# Patient Record
Sex: Female | Born: 1955 | Race: White | Hispanic: No | Smoking: Never smoker
Health system: Southern US, Community
[De-identification: ages and names within clinical notes are randomized; demographics above are authoritative.]

## PROBLEM LIST (undated history)

## (undated) DIAGNOSIS — R569 Unspecified convulsions: Secondary | ICD-10-CM

## (undated) DIAGNOSIS — K5090 Crohn's disease, unspecified, without complications: Secondary | ICD-10-CM

## (undated) DIAGNOSIS — K8020 Calculus of gallbladder without cholecystitis without obstruction: Secondary | ICD-10-CM

## (undated) DIAGNOSIS — I10 Essential (primary) hypertension: Secondary | ICD-10-CM

## (undated) HISTORY — PX: CARPAL TUNNEL RELEASE: SHX101

## (undated) HISTORY — DX: Crohn's disease, unspecified, without complications: K50.90

## (undated) HISTORY — DX: Calculus of gallbladder without cholecystitis without obstruction: K80.20

---

## 2002-02-19 ENCOUNTER — Other Ambulatory Visit: Admission: RE | Admit: 2002-02-19 | Discharge: 2002-02-19 | Payer: Self-pay | Admitting: *Deleted

## 2004-06-22 ENCOUNTER — Other Ambulatory Visit: Admission: RE | Admit: 2004-06-22 | Discharge: 2004-06-22 | Payer: Self-pay | Admitting: Gynecology

## 2004-10-13 ENCOUNTER — Other Ambulatory Visit: Admission: RE | Admit: 2004-10-13 | Discharge: 2004-10-13 | Payer: Self-pay | Admitting: Gynecology

## 2005-04-15 ENCOUNTER — Other Ambulatory Visit: Admission: RE | Admit: 2005-04-15 | Discharge: 2005-04-15 | Payer: Self-pay | Admitting: Gynecology

## 2020-02-10 ENCOUNTER — Other Ambulatory Visit: Payer: Self-pay | Admitting: Gastroenterology

## 2020-02-10 DIAGNOSIS — R9389 Abnormal findings on diagnostic imaging of other specified body structures: Secondary | ICD-10-CM

## 2020-02-11 ENCOUNTER — Other Ambulatory Visit: Payer: Self-pay | Admitting: Gastroenterology

## 2020-02-11 DIAGNOSIS — R933 Abnormal findings on diagnostic imaging of other parts of digestive tract: Secondary | ICD-10-CM

## 2020-02-12 ENCOUNTER — Ambulatory Visit
Admission: RE | Admit: 2020-02-12 | Discharge: 2020-02-12 | Disposition: A | Discharge status: Home / Self Care | Payer: BC Managed Care – PPO | Source: Ambulatory Visit | Attending: Gastroenterology | Admitting: Gastroenterology

## 2020-02-12 DIAGNOSIS — R933 Abnormal findings on diagnostic imaging of other parts of digestive tract: Secondary | ICD-10-CM

## 2020-02-12 MED ORDER — IOPAMIDOL (ISOVUE-300) INJECTION 61%
100.0000 mL | Freq: Once | INTRAVENOUS | Status: AC | PRN
  Administered 2020-02-12: 100 mL via INTRAVENOUS

## 2021-03-26 IMAGING — CT CT ENTEROGRAPHY (ABD-PELV W/ CM)
1 of 6 series · 10 of 32 positions shown, 16 images · IV contrast (APPLIED)
Comparison: None

CLINICAL DATA: Terminal ileal ulceration on colonoscopy.

EXAM:
CT ABDOMEN AND PELVIS WITH CONTRAST (ENTEROGRAPHY)
TECHNIQUE: Multidetector CT of the abdomen and pelvis during bolus
administration of intravenous contrast. Negative oral contrast was
given.
CONTRAST:  100mL [id number] IOPAMIDOL (YM3JTK-RMM) INJECTION 61%

[Series 3: enterography 3 mm · axial · 0.92mm/px · z∈[+557,+925]mm · 10 of 450 slices shown, 16 images]
[im 41/450  soft-tissue]
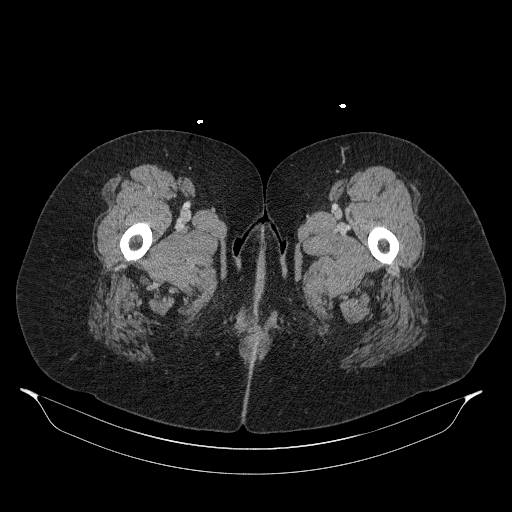
[im 41/450  bone]
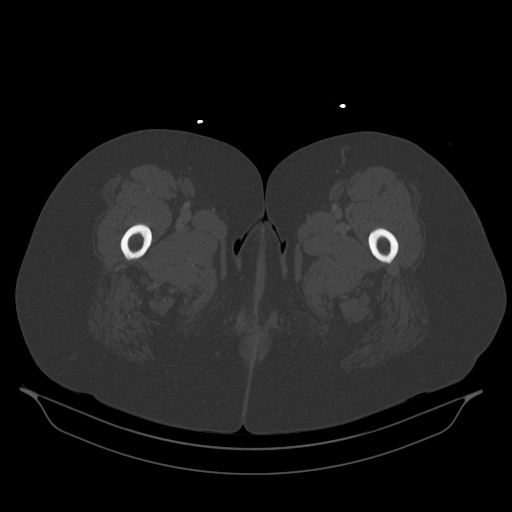
[im 82/450  soft-tissue]
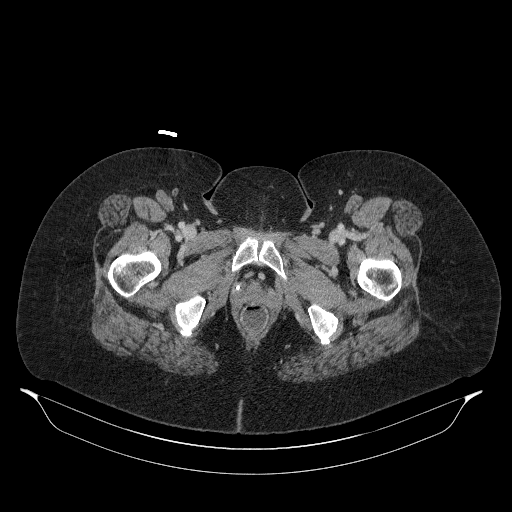
[im 123/450  soft-tissue]
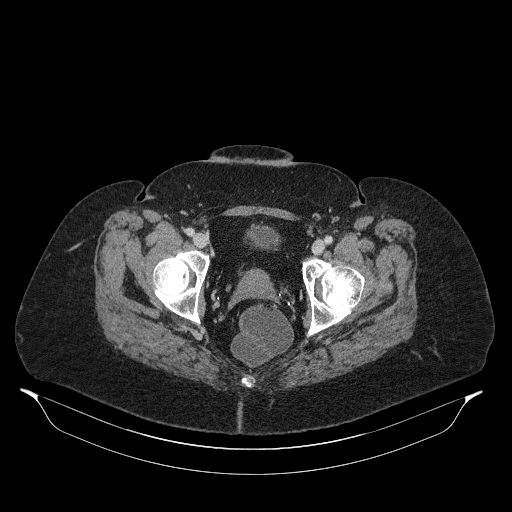
[im 164/450  soft-tissue]
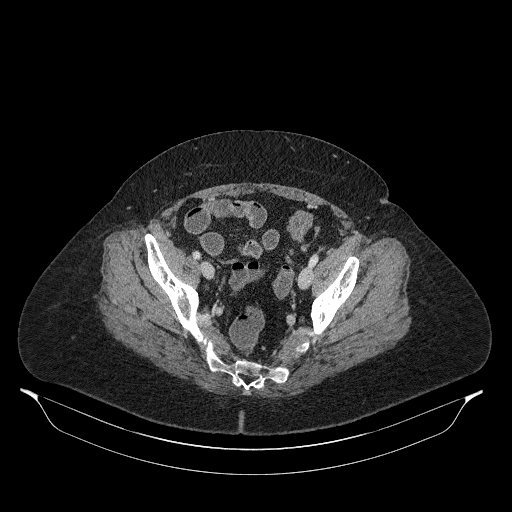
[im 205/450  soft-tissue]
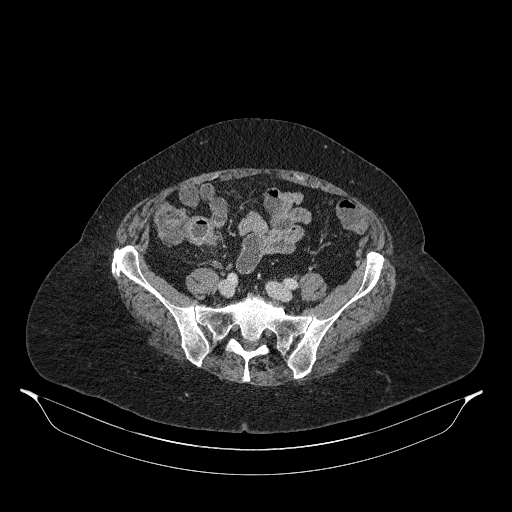
[im 245/450  soft-tissue]
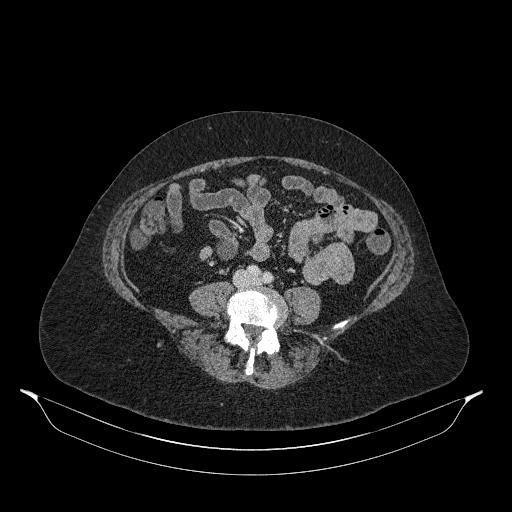
[im 286/450  soft-tissue]
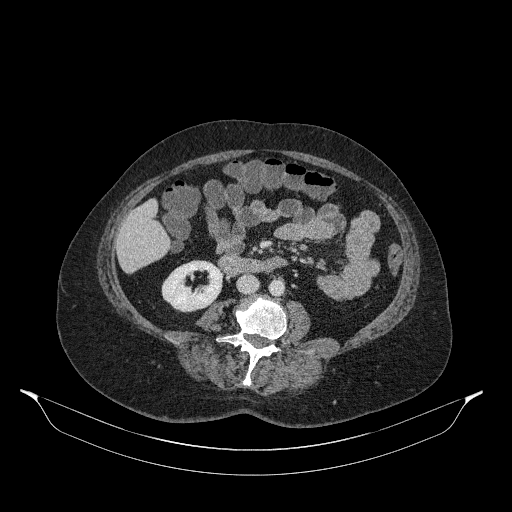
[im 286/450  lung]
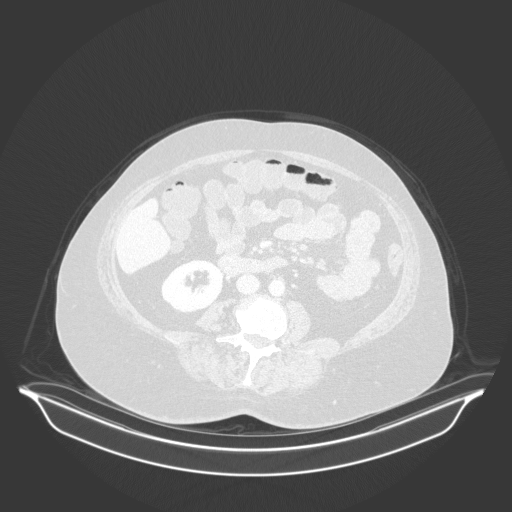
[im 327/450  soft-tissue]
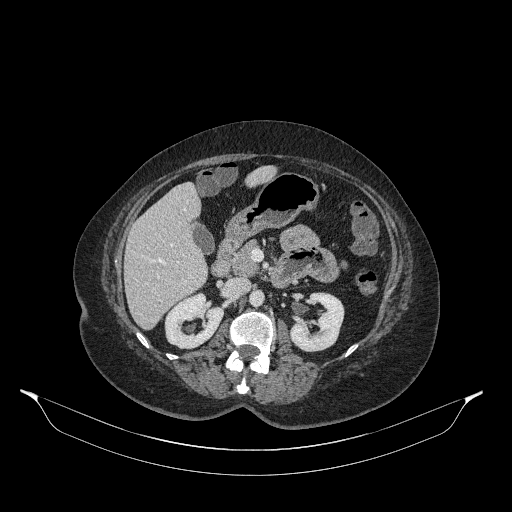
[im 327/450  lung]
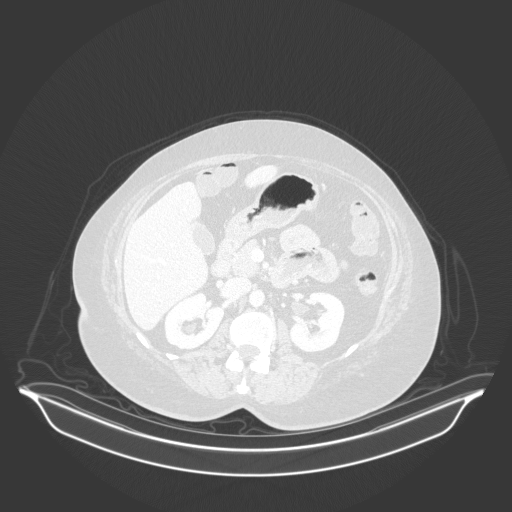
[im 368/450  soft-tissue]
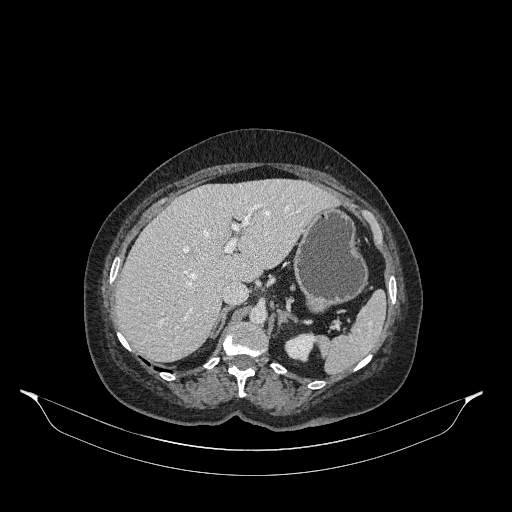
[im 368/450  lung]
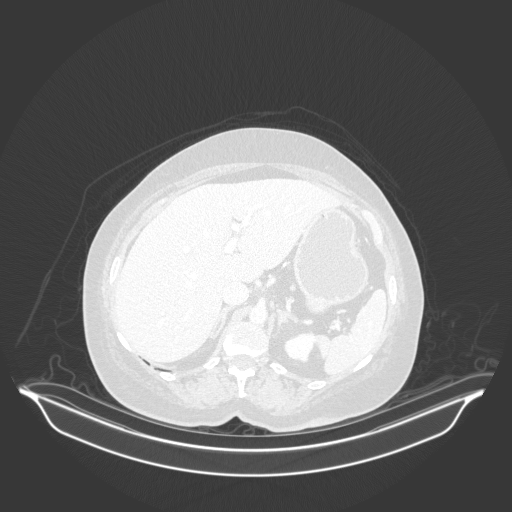
[im 368/450  bone]
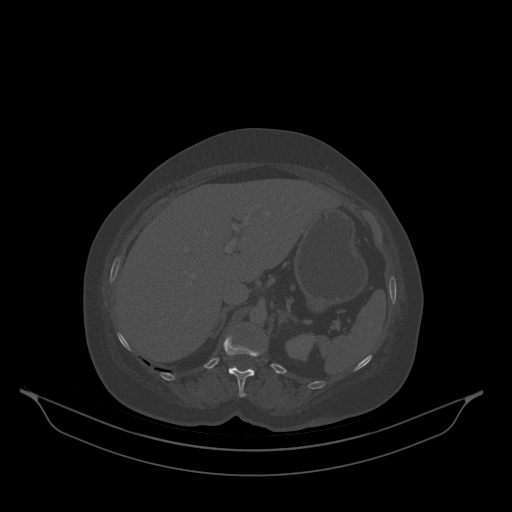
[im 409/450  soft-tissue]
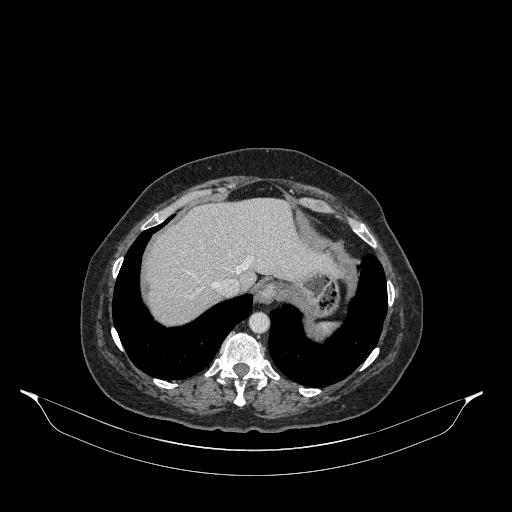
[im 409/450  lung]
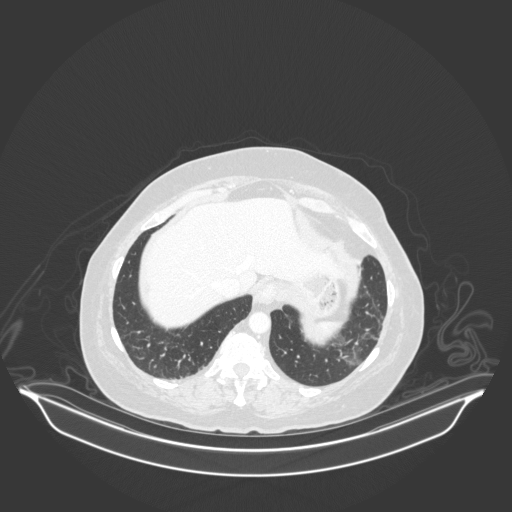

[10 of 32 positions shown; findings below may reference images not displayed]

FINDINGS: Lower chest: Clear lung bases. Normal heart size without pericardial
or pleural effusion. Tiny hiatal hernia.

Hepatobiliary: Normal liver. Normal gallbladder, without biliary
ductal dilatation.

Pancreas: Normal, without mass or ductal dilatation.

Spleen: Normal in size, without focal abnormality.

Adrenals/Urinary Tract: Normal adrenal glands. At least partially
duplicated right renal collecting system. Normal left kidney and
urinary bladder.

Stomach/Bowel: Normal remainder of the stomach. Fluid-filled colon
is likely due to neutral contrast administration. No colitis. Normal
appendix. The extent of small bowel distension with neutral contrast
is moderate. Mild terminal and distal ileal mucosal hyperenhancement
with narrowing versus underdistention at the ileocecal junction.
Example 79/10. No evidence of high-grade obstruction. Mild
perienteric edema.

No surrounding fluid collection.

Vascular/Lymphatic: Aortic atherosclerosis. Ileocolic mesenteric
adenopathy is mild. Example 8 mm node on 70/10.

Reproductive: Normal uterus and adnexa.

Other: No significant free fluid.  No free intraperitoneal air.

Musculoskeletal: Lumbosacral spondylosis. Minimal convex left lumbar
spine curvature.
IMPRESSION: 1. Terminal and distal ileitis. Given the clinical history, favored
to represent Crohn disease. Infectious enteritis could look similar.
Mild adjacent ileocolic mesenteric adenopathy, likely reactive.
2. Possible narrowing of the ileocecal junction, without evidence of
high-grade obstruction.
3.  Aortic Atherosclerosis ([id number]).

## 2022-01-04 ENCOUNTER — Other Ambulatory Visit (HOSPITAL_COMMUNITY): Payer: Self-pay | Admitting: Gastroenterology

## 2022-01-04 ENCOUNTER — Other Ambulatory Visit: Payer: Self-pay | Admitting: Gastroenterology

## 2022-01-04 DIAGNOSIS — R1011 Right upper quadrant pain: Secondary | ICD-10-CM

## 2022-01-18 ENCOUNTER — Other Ambulatory Visit (HOSPITAL_COMMUNITY): Payer: BC Managed Care – PPO

## 2022-01-18 ENCOUNTER — Encounter (HOSPITAL_COMMUNITY): Payer: BC Managed Care – PPO

## 2022-01-19 ENCOUNTER — Ambulatory Visit (HOSPITAL_COMMUNITY)
Admission: RE | Admit: 2022-01-19 | Discharge: 2022-01-19 | Disposition: A | Discharge status: Home / Self Care | Payer: BC Managed Care – PPO | Source: Ambulatory Visit | Attending: Gastroenterology | Admitting: Gastroenterology

## 2022-01-19 ENCOUNTER — Encounter (HOSPITAL_COMMUNITY): Payer: Self-pay

## 2022-01-19 ENCOUNTER — Ambulatory Visit (HOSPITAL_COMMUNITY): Admission: RE | Admit: 2022-01-19 | Payer: BC Managed Care – PPO | Source: Ambulatory Visit

## 2022-01-19 DIAGNOSIS — R1011 Right upper quadrant pain: Secondary | ICD-10-CM | POA: Diagnosis not present

## 2022-04-10 ENCOUNTER — Encounter (HOSPITAL_COMMUNITY): Payer: Self-pay | Admitting: Emergency Medicine

## 2022-04-10 ENCOUNTER — Other Ambulatory Visit: Payer: Self-pay

## 2022-04-10 ENCOUNTER — Emergency Department (HOSPITAL_COMMUNITY)
Admission: EM | Admit: 2022-04-10 | Discharge: 2022-04-11 | Disposition: A | Discharge status: Home / Self Care | Payer: BC Managed Care – PPO | Attending: Emergency Medicine | Admitting: Emergency Medicine

## 2022-04-10 DIAGNOSIS — K8050 Calculus of bile duct without cholangitis or cholecystitis without obstruction: Secondary | ICD-10-CM

## 2022-04-10 DIAGNOSIS — D649 Anemia, unspecified: Secondary | ICD-10-CM

## 2022-04-10 DIAGNOSIS — R1013 Epigastric pain: Secondary | ICD-10-CM | POA: Diagnosis present

## 2022-04-10 DIAGNOSIS — N289 Disorder of kidney and ureter, unspecified: Secondary | ICD-10-CM | POA: Diagnosis not present

## 2022-04-10 DIAGNOSIS — E871 Hypo-osmolality and hyponatremia: Secondary | ICD-10-CM | POA: Insufficient documentation

## 2022-04-10 DIAGNOSIS — R030 Elevated blood-pressure reading, without diagnosis of hypertension: Secondary | ICD-10-CM

## 2022-04-10 DIAGNOSIS — K8020 Calculus of gallbladder without cholecystitis without obstruction: Secondary | ICD-10-CM | POA: Insufficient documentation

## 2022-04-10 MED ORDER — OXYCODONE-ACETAMINOPHEN 5-325 MG PO TABS
2.0000 | ORAL_TABLET | Freq: Once | ORAL | Status: AC
  Administered 2022-04-10: 2 via ORAL
  Filled 2022-04-10: qty 2

## 2022-04-10 NOTE — ED Provider Triage Note (Signed)
  Emergency Medicine Provider Triage Evaluation Note  MRN:  [id number]  Arrival date & time: 04/10/22    Medically screening exam initiated at 11:41 PM.   CC:   Abdominal Pain   HPI:  Julie Wu is a 66 y.o. year-old female presents to the ED with chief complaint of symptomatic cholelithiasis.  Has had persistent daily pain. States that she has seen general surgery and has cholecystectomy planned, but she can't wait because the pain is so severe.  She denies vomiting or fever.  History provided by History provided by patient. ROS:  -As included in HPI PE:   Vitals:   04/10/22 2340  BP: (!) 156/93  Pulse: 74  Resp: 16  Temp: 97.7 F (36.5 C)  SpO2: 100%    Non-toxic appearing No respiratory distress RUQ pain MDM:  Based on signs and symptoms, cholelithiasis is highest on my differential, followed by cholecystitis. I've ordered labs and [place] in triage to expedite lab/diagnostic workup.  Consider surgical consultation for symptomatic cholelithiasis.  Patient was informed that the remainder of the evaluation will be completed by another provider, this initial triage assessment does not replace that evaluation, and the importance of remaining in the ED until their evaluation is complete.    Roxy Horseman, PA-C 04/10/22 2352

## 2022-04-10 NOTE — ED Triage Notes (Signed)
Patient reports persistent RUQ abdominal pain , history of gallstones and Chron's disease.

## 2022-04-11 ENCOUNTER — Emergency Department (HOSPITAL_COMMUNITY): Payer: BC Managed Care – PPO

## 2022-04-11 LAB — URINALYSIS, ROUTINE W REFLEX MICROSCOPIC
Bilirubin Urine: NEGATIVE
Glucose, UA: NEGATIVE mg/dL
Ketones, ur: NEGATIVE mg/dL
Nitrite: NEGATIVE
Protein, ur: NEGATIVE mg/dL
Specific Gravity, Urine: 1.008 (ref 1.005–1.030)
pH: 5 (ref 5.0–8.0)

## 2022-04-11 LAB — CBC WITH DIFFERENTIAL/PLATELET
Abs Immature Granulocytes: 0.03 10*3/uL (ref 0.00–0.07)
Basophils Absolute: 0 10*3/uL (ref 0.0–0.1)
Basophils Relative: 1 %
Eosinophils Absolute: 0.2 10*3/uL (ref 0.0–0.5)
Eosinophils Relative: 3 %
HCT: 34.2 % — ABNORMAL LOW (ref 36.0–46.0)
Hemoglobin: 11.7 g/dL — ABNORMAL LOW (ref 12.0–15.0)
Immature Granulocytes: 0 %
Lymphocytes Relative: 26 %
Lymphs Abs: 2.3 10*3/uL (ref 0.7–4.0)
MCH: 34 pg (ref 26.0–34.0)
MCHC: 34.2 g/dL (ref 30.0–36.0)
MCV: 99.4 fL (ref 80.0–100.0)
Monocytes Absolute: 0.7 10*3/uL (ref 0.1–1.0)
Monocytes Relative: 8 %
Neutro Abs: 5.5 10*3/uL (ref 1.7–7.7)
Neutrophils Relative %: 62 %
Platelets: 297 10*3/uL (ref 150–400)
RBC: 3.44 MIL/uL — ABNORMAL LOW (ref 3.87–5.11)
RDW: 12.8 % (ref 11.5–15.5)
WBC: 8.7 10*3/uL (ref 4.0–10.5)
nRBC: 0 % (ref 0.0–0.2)

## 2022-04-11 LAB — COMPREHENSIVE METABOLIC PANEL
ALT: 31 U/L (ref 0–44)
AST: 23 U/L (ref 15–41)
Albumin: 4.4 g/dL (ref 3.5–5.0)
Alkaline Phosphatase: 91 U/L (ref 38–126)
Anion gap: 12 (ref 5–15)
BUN: 36 mg/dL — ABNORMAL HIGH (ref 8–23)
CO2: 24 mmol/L (ref 22–32)
Calcium: 9.2 mg/dL (ref 8.9–10.3)
Chloride: 97 mmol/L — ABNORMAL LOW (ref 98–111)
Creatinine, Ser: 1.31 mg/dL — ABNORMAL HIGH (ref 0.44–1.00)
GFR, Estimated: 45 mL/min — ABNORMAL LOW (ref >60)
Glucose, Bld: 116 mg/dL — ABNORMAL HIGH (ref 70–99)
Potassium: 4.3 mmol/L (ref 3.5–5.1)
Sodium: 133 mmol/L — ABNORMAL LOW (ref 135–145)
Total Bilirubin: 0.4 mg/dL (ref 0.3–1.2)
Total Protein: 7.4 g/dL (ref 6.5–8.1)

## 2022-04-11 LAB — LIPASE, BLOOD: Lipase: 34 U/L (ref 11–51)

## 2022-04-11 MED ORDER — OXYCODONE-ACETAMINOPHEN 5-325 MG PO TABS: 2.0000 | ORAL_TABLET | ORAL | 0 refills | Status: DC | PRN

## 2022-04-11 MED ORDER — ONDANSETRON 8 MG PO TBDP: 8.0000 mg | ORAL_TABLET | Freq: Three times a day (TID) | ORAL | 0 refills | Status: AC | PRN

## 2022-04-11 NOTE — ED Provider Notes (Signed)
[facility] EMERGENCY DEPARTMENT Provider Note   CSN: [id number] Arrival date & time: 04/10/22  2327     History  Chief Complaint  Patient presents with   Abdominal Pain    Julie Wu is a 66 y.o. female.  The history is provided by the patient.  Abdominal Pain She has history of cholelithiasis and comes in with epigastric pain typical of her biliary colic episodes.  Pain started at about 11 PM.  She had eaten what seems to be a low-fat meal prior to this.  Pain did radiate around to her back.  There is no associated nausea or vomiting.  She had a similar episode 3 nights ago which lasted all night.  She had taken a dose of acetaminophen without relief.  She was worried that the pain would last all night, so she came to the emergency department.  She did receive a dose of oxycodone-acetaminophen in triage, and states that the pain was completely resolved within 20 minutes of taking that medication.  Of note, she had seen a general surgeon and had cholecystectomy scheduled for 05/02/2022, but had to reschedule because of family issues.  As of now, surgery had not been rescheduled.   Home Medications Prior to Admission medications   Medication Sig Start Date End Date Taking? Authorizing Provider  ondansetron (ZOFRAN-ODT) 8 MG disintegrating tablet Take 1 tablet (8 mg total) by mouth every 8 (eight) hours as needed for nausea or vomiting. 04/11/22  Yes Dione Booze, MD  oxyCODONE-acetaminophen (PERCOCET) 5-325 MG tablet Take 2 tablets by mouth every 4 (four) hours as needed for moderate pain or severe pain. 04/11/22  Yes Dione Booze, MD      Allergies    Patient has no known allergies.    Review of Systems   Review of Systems  Gastrointestinal:  Positive for abdominal pain.  All other systems reviewed and are negative.   Physical Exam Updated Vital Signs BP (!) 125/56   Pulse 62   Temp 97.6 F (36.4 C) (Oral)   Resp 17   SpO2 97%  Physical Exam Vitals  and nursing note reviewed.   66 year old female, resting comfortably and in no acute distress. Vital signs are significant for mildly elevated blood pressure. Oxygen saturation is 100%, which is normal. Head is normocephalic and atraumatic. PERRLA, EOMI. Oropharynx is clear. Neck is nontender and supple without adenopathy or JVD. Back is nontender and there is no CVA tenderness. Lungs are clear without rales, wheezes, or rhonchi. Chest is nontender. Heart has regular rate and rhythm without murmur. Abdomen is soft, flat, nontender without masses or hepatosplenomegaly and peristalsis is normoactive. Extremities have no cyanosis or edema, full range of motion is present. Skin is warm and dry without rash. Neurologic: Mental status is normal, cranial nerves are intact, moves all extremities equally.  ED Results / Procedures / Treatments   Labs (all labs ordered are listed, but only abnormal results are displayed) Labs Reviewed  CBC WITH DIFFERENTIAL/PLATELET - Abnormal; Notable for the following components:      Result Value   RBC 3.44 (*)    Hemoglobin 11.7 (*)    HCT 34.2 (*)    All other components within normal limits  COMPREHENSIVE METABOLIC PANEL - Abnormal; Notable for the following components:   Sodium 133 (*)    Chloride 97 (*)    Glucose, Bld 116 (*)    BUN 36 (*)    Creatinine, Ser 1.31 (*)  GFR, Estimated 45 (*)    All other components within normal limits  URINALYSIS, ROUTINE W REFLEX MICROSCOPIC - Abnormal; Notable for the following components:   Color, Urine STRAW (*)    Hgb urine dipstick MODERATE (*)    Leukocytes,Ua MODERATE (*)    Bacteria, UA RARE (*)    All other components within normal limits  LIPASE, BLOOD   Radiology US Abdomen Limited  Result Date: 04/11/2022 CLINICAL DATA:  Right upper quadrant pain EXAM: ULTRASOUND ABDOMEN LIMITED RIGHT UPPER QUADRANT COMPARISON:  Ultrasound 01/19/2022.  CT 02/12/2020 FINDINGS: Gallbladder: Cholelithiasis with  mobile gallstone present. No gallbladder wall thickening or edema. Murphy's sign is not applicable as the patient has had pain medication. Common bile duct: Diameter: 5 mm, normal Liver: No focal lesion identified. Within normal limits in parenchymal echogenicity. Portal vein is patent on color Doppler imaging with normal direction of blood flow towards the liver. Other: None. IMPRESSION: Cholelithiasis.  No evidence of acute cholecystitis. Electronically Signed   By: Burman Nieves M.D.   On: 04/11/2022 01:49    Procedures Procedures    Medications Ordered in ED Medications  oxyCODONE-acetaminophen (PERCOCET/ROXICET) 5-325 MG per tablet 2 tablet (2 tablets Oral Given 04/10/22 2350)    ED Course/ Medical Decision Making/ A&P                           Medical Decision Making Risk Prescription drug management.   Epigastric pain consistent with biliary colic.  Doubt pancreatitis, peptic ulcer, pancreatitis.  Old records are reviewed, and she has no relevant past visits to the emergency department.  I do note an office visit on 03/08/2022 with general surgery for symptomatic cholelithiasis at which time elective surgery was scheduled.  Currently, she is pain-free and with a benign abdominal exam.  Ultrasound does show cholelithiasis without evidence of cholecystitis.  I have independently viewed the images, and agree with radiologist's interpretation.  I have reviewed and interpreted her laboratory tests and my interpretation is mild anemia, mild hyponatremia, mild elevation of creatinine all of which are not felt to be clinically significant.  However, on review of care everywhere, she did have a normal creatinine on 01/24/2022.  I have discussed the case with Dr. Bonita Quin from [facility] surgery, who states that she will work with the office to try to get elective surgery scheduled soon so that she can avoid additional ED visits and potential for emergent surgery.  I have encouraged the patient to  drink plenty of fluids and have her creatinine rechecked in 1 week.  I have written prescriptions for oxycodone-acetaminophen and ondansetron to use as needed if she has recurrent episodes of pain.  Return precautions discussed.  Final Clinical Impression(s) / ED Diagnoses Final diagnoses:  Biliary colic  Elevated blood pressure reading without diagnosis of hypertension  Renal insufficiency  Normochromic normocytic anemia  Hyponatremia    Rx / DC Orders ED Discharge Orders          Ordered    ondansetron (ZOFRAN-ODT) 8 MG disintegrating tablet  Every 8 hours PRN        04/11/22 0444    oxyCODONE-acetaminophen (PERCOCET) 5-325 MG tablet  Every 4 hours PRN        04/11/22 0444              Dione Booze, MD 04/11/22 [phone]

## 2022-04-11 NOTE — Discharge Instructions (Signed)
Your creatinine (a blood test measuring your kidney function) was a little high today.  Please make sure to drink plenty of fluids.  Please see your primary care provider in 1 week to have the blood test repeated to make sure it is not getting worse.  Return to the emergency department if you are having pain which is not being relieved with the pain medication.  Also, if you ever have pain with fever, you need to come to the emergency department immediately.

## 2022-04-12 ENCOUNTER — Ambulatory Visit: Payer: Self-pay | Admitting: Surgery

## 2022-04-12 DIAGNOSIS — Z01818 Encounter for other preprocedural examination: Secondary | ICD-10-CM

## 2022-04-12 NOTE — Progress Notes (Signed)
Sent message, via epic in basket, requesting orders in epic from surgeon.  

## 2022-04-12 NOTE — Progress Notes (Addendum)
COVID Vaccine Completed: yes x5  Date of COVID positive in last 90 days: no  PCP - Burnett Kanaris, PA Cardiologist - n/a  Chest x-ray - n/a EKG - 04/15/22 Epic/chart Stress Test - n/a ECHO - n/a Cardiac Cath - n/a Pacemaker/ICD device last checked: n/a Spinal Cord Stimulator: n/a  Bowel Prep - no  Sleep Study - n/a CPAP -   Fasting Blood Sugar - n/a Checks Blood Sugar _____ times a day  Blood Thinner Instructions: n/a Aspirin Instructions: Last Dose:  Activity level: Can go up a flight of stairs and perform activities of daily living without stopping and without symptoms of chest pain or shortness of breath.  Anesthesia review:   Patient denies shortness of breath, fever, cough and chest pain at PAT appointment  Patient verbalized understanding of instructions that were given to them at the PAT appointment. Patient was also instructed that they will need to review over the PAT instructions again at home before surgery.

## 2022-04-12 NOTE — Patient Instructions (Addendum)
SURGICAL WAITING ROOM VISITATION Patients having surgery or a procedure may have no more than 2 support people in the waiting area - these visitors may rotate.   Children under the age of 36 must have an adult with them who is not the patient. If the patient needs to stay at the hospital during part of their recovery, the visitor guidelines for inpatient rooms apply. Pre-op nurse will coordinate an appropriate time for 1 support person to accompany patient in pre-op.  This support person may not rotate.    Please refer to the [facility] website for the visitor guidelines for Inpatients (after your surgery is over and you are in a regular room).    Your procedure is scheduled on: 04/22/22   Report to [facility] Main Entrance    Report to admitting at 10:30 AM   Call this number if you have problems the morning of surgery [phone]   Do not eat food :After Midnight.   After Midnight you may have the following liquids until 10:00 AM DAY OF SURGERY  Water Non-Citrus Juices (without pulp, NO RED) Carbonated Beverages Black Coffee (NO MILK/CREAM OR CREAMERS, sugar ok)  Clear Tea (NO MILK/CREAM OR CREAMERS, sugar ok) regular and decaf                             Plain Jell-O (NO RED)                                           Fruit ices (not with fruit pulp, NO RED)                                     Popsicles (NO RED)                                                               Sports drinks like Gatorade (NO RED  FOLLOW BOWEL PREP AND ANY ADDITIONAL PRE OP INSTRUCTIONS YOU RECEIVED FROM YOUR SURGEON'S OFFICE!!!     Oral Hygiene is also important to reduce your risk of infection.                                    Remember - BRUSH YOUR TEETH THE MORNING OF SURGERY WITH YOUR REGULAR TOOTHPASTE   Take these medicines the morning of surgery with A SIP OF WATER: Zofran, Percocet, Doxycycline                               You may not have any metal on your body including hair  pins, jewelry, and body piercing             Do not wear make-up, lotions, powders, perfumes, or deodorant  Do not wear nail polish including gel and S&S, artificial/acrylic nails, or any other type of covering on natural nails including finger and toenails. If you have artificial nails, gel coating, etc. that needs  to be removed by a nail salon please have this removed prior to surgery or surgery may need to be canceled/ delayed if the surgeon/ anesthesia feels like they are unable to be safely monitored.   Do not shave  48 hours prior to surgery.    Do not bring valuables to the hospital. [place] IS NOT             RESPONSIBLE   FOR VALUABLES.  DO NOT BRING YOUR HOME MEDICATIONS TO THE HOSPITAL. PHARMACY WILL DISPENSE MEDICATIONS LISTED ON YOUR MEDICATION LIST TO YOU DURING YOUR ADMISSION IN THE HOSPITAL!    Patients discharged on the day of surgery will not be allowed to drive home.  Someone NEEDS to stay with you for the first 24 hours after anesthesia.   Special Instructions: Bring a copy of your healthcare power of attorney and living will documents         the day of surgery if you haven't scanned them before.              Please read over the following fact sheets you were given: IF YOU HAVE QUESTIONS ABOUT YOUR PRE-OP INSTRUCTIONS PLEASE CALL [phone]- [facility] - Preparing for Surgery Before surgery, you can play an important role.  Because skin is not sterile, your skin needs to be as free of germs as possible.  You can reduce the number of germs on your skin by washing with CHG (chlorahexidine gluconate) soap before surgery.  CHG is an antiseptic cleaner which kills germs and bonds with the skin to continue killing germs even after washing. Please DO NOT use if you have an allergy to CHG or antibacterial soaps.  If your skin becomes reddened/irritated stop using the CHG and inform your nurse when you arrive at Short Stay. Do not shave (including legs and  underarms) for at least 48 hours prior to the first CHG shower.  You may shave your face/neck.  Please follow these instructions carefully:  1.  Shower with CHG Soap the night before surgery and the  morning of surgery.  2.  If you choose to wash your hair, wash your hair first as usual with your normal  shampoo.  3.  After you shampoo, rinse your hair and body thoroughly to remove the shampoo.                             4.  Use CHG as you would any other liquid soap.  You can apply chg directly to the skin and wash.  Gently with a scrungie or clean washcloth.  5.  Apply the CHG Soap to your body ONLY FROM THE NECK DOWN.   Do   not use on face/ open                           Wound or open sores. Avoid contact with eyes, ears mouth and   genitals (private parts).                       Wash face,  Genitals (private parts) with your normal soap.             6.  Wash thoroughly, paying special attention to the area where your    surgery  will be performed.  7.  Thoroughly rinse your body with warm water from  the neck down.  8.  DO NOT shower/wash with your normal soap after using and rinsing off the CHG Soap.                9.  Pat yourself dry with a clean towel.            10.  Wear clean pajamas.            11.  Place clean sheets on your bed the night of your first shower and do not  sleep with pets. Day of Surgery : Do not apply any lotions/deodorants the morning of surgery.  Please wear clean clothes to the hospital/surgery center.  FAILURE TO FOLLOW THESE INSTRUCTIONS MAY RESULT IN THE CANCELLATION OF YOUR SURGERY  PATIENT SIGNATURE_________________________________  NURSE SIGNATURE__________________________________  ________________________________________________________________________

## 2022-04-15 ENCOUNTER — Encounter (HOSPITAL_COMMUNITY): Payer: Self-pay

## 2022-04-15 ENCOUNTER — Encounter (HOSPITAL_COMMUNITY)
Admission: RE | Admit: 2022-04-15 | Discharge: 2022-04-15 | Disposition: A | Discharge status: Home / Self Care | Payer: BC Managed Care – PPO | Source: Ambulatory Visit | Attending: Surgery | Admitting: Surgery

## 2022-04-15 VITALS — BP 138/72 | HR 75 | Resp 14 | Ht 61.5 in | Wt 170.0 lb

## 2022-04-15 DIAGNOSIS — Z01810 Encounter for preprocedural cardiovascular examination: Secondary | ICD-10-CM | POA: Insufficient documentation

## 2022-04-15 DIAGNOSIS — I2510 Atherosclerotic heart disease of native coronary artery without angina pectoris: Secondary | ICD-10-CM | POA: Diagnosis not present

## 2022-04-15 HISTORY — DX: Unspecified convulsions: R56.9

## 2022-04-15 HISTORY — DX: Essential (primary) hypertension: I10

## 2022-04-15 NOTE — Progress Notes (Signed)
Spoke with triage nurse from [facility] and let them know about patient's in office procedure 04/14/22. She had a carpal tunnel release. A cast is currently on her left arm/wrist. She is going in next week to possibly have the cast removed but the stiches will still be present. RN will make sure Dr. Cliffton Asters is aware.

## 2022-04-21 NOTE — Anesthesia Preprocedure Evaluation (Addendum)
Anesthesia Evaluation  Patient identified by MRN, date of birth, ID band Patient awake    Reviewed: Allergy & Precautions, NPO status , Patient's Chart, lab work & pertinent test results  Airway Mallampati: III  TM Distance: >3 FB Neck ROM: Full    Dental  (+) Teeth Intact, Dental Advisory Given   Pulmonary  Snores at night, has never had sleep study   Pulmonary exam normal breath sounds clear to auscultation       Cardiovascular hypertension (148/73 in preop), Pt. on medications Normal cardiovascular exam Rhythm:Regular Rate:Normal     Neuro/Psych Seizures - (childhood), Well Controlled,  negative psych ROS   GI/Hepatic Neg liver ROS, Symptomatic cholelithiasis    Endo/Other  negative endocrine ROS  Renal/GU Renal InsufficiencyRenal diseaseCr 1.31  negative genitourinary   Musculoskeletal negative musculoskeletal ROS (+)   Abdominal   Peds  Hematology  (+) Blood dyscrasia, anemia , Hb 11.7   Anesthesia Other Findings   Reproductive/Obstetrics negative OB ROS                           Anesthesia Physical Anesthesia Plan  ASA: 2  Anesthesia Plan: General   Post-op Pain Management: Tylenol PO (pre-op)*   Induction: Intravenous  PONV Risk Score and Plan: 4 or greater and Ondansetron, Dexamethasone, Midazolam and Treatment may vary due to age or medical condition  Airway Management Planned: Oral ETT and Video Laryngoscope Planned  Additional Equipment: None  Intra-op Plan:   Post-operative Plan: Extubation in OR  Informed Consent: I have reviewed the patients History and Physical, chart, labs and discussed the procedure including the risks, benefits and alternatives for the proposed anesthesia with the patient or authorized representative who has indicated his/her understanding and acceptance.     Dental advisory given  Plan Discussed with: CRNA  Anesthesia Plan Comments:        Anesthesia Quick Evaluation

## 2022-04-22 ENCOUNTER — Ambulatory Visit (HOSPITAL_COMMUNITY): Payer: BC Managed Care – PPO | Admitting: Anesthesiology

## 2022-04-22 ENCOUNTER — Other Ambulatory Visit (HOSPITAL_COMMUNITY): Payer: BC Managed Care – PPO

## 2022-04-22 ENCOUNTER — Ambulatory Visit (HOSPITAL_COMMUNITY): Payer: BC Managed Care – PPO | Admitting: Physician Assistant

## 2022-04-22 ENCOUNTER — Encounter (HOSPITAL_COMMUNITY)
Admission: RE | Disposition: A | Discharge status: Home / Self Care | Payer: Self-pay | Source: Home / Self Care | Attending: Surgery

## 2022-04-22 ENCOUNTER — Other Ambulatory Visit: Payer: Self-pay

## 2022-04-22 ENCOUNTER — Ambulatory Visit (HOSPITAL_COMMUNITY)
Admission: RE | Admit: 2022-04-22 | Discharge: 2022-04-22 | Disposition: A | Discharge status: Home / Self Care | Payer: BC Managed Care – PPO | Attending: Surgery | Admitting: Surgery

## 2022-04-22 ENCOUNTER — Encounter (HOSPITAL_COMMUNITY): Payer: Self-pay | Admitting: Surgery

## 2022-04-22 DIAGNOSIS — D649 Anemia, unspecified: Secondary | ICD-10-CM | POA: Diagnosis not present

## 2022-04-22 DIAGNOSIS — K5090 Crohn's disease, unspecified, without complications: Secondary | ICD-10-CM | POA: Diagnosis not present

## 2022-04-22 DIAGNOSIS — Z79899 Other long term (current) drug therapy: Secondary | ICD-10-CM | POA: Insufficient documentation

## 2022-04-22 DIAGNOSIS — K8010 Calculus of gallbladder with chronic cholecystitis without obstruction: Secondary | ICD-10-CM | POA: Diagnosis present

## 2022-04-22 DIAGNOSIS — D759 Disease of blood and blood-forming organs, unspecified: Secondary | ICD-10-CM | POA: Insufficient documentation

## 2022-04-22 DIAGNOSIS — N289 Disorder of kidney and ureter, unspecified: Secondary | ICD-10-CM | POA: Diagnosis not present

## 2022-04-22 DIAGNOSIS — Z01818 Encounter for other preprocedural examination: Secondary | ICD-10-CM

## 2022-04-22 HISTORY — PX: CHOLECYSTECTOMY: SHX55

## 2022-04-22 LAB — COMPREHENSIVE METABOLIC PANEL
ALT: 47 U/L — ABNORMAL HIGH (ref 0–44)
AST: 32 U/L (ref 15–41)
Albumin: 4.1 g/dL (ref 3.5–5.0)
Alkaline Phosphatase: 74 U/L (ref 38–126)
Anion gap: 7 (ref 5–15)
BUN: 23 mg/dL (ref 8–23)
CO2: 25 mmol/L (ref 22–32)
Calcium: 9.2 mg/dL (ref 8.9–10.3)
Chloride: 108 mmol/L (ref 98–111)
Creatinine, Ser: 0.75 mg/dL (ref 0.44–1.00)
GFR, Estimated: >60 mL/min (ref >60)
Glucose, Bld: 95 mg/dL (ref 70–99)
Potassium: 4.3 mmol/L (ref 3.5–5.1)
Sodium: 140 mmol/L (ref 135–145)
Total Bilirubin: 0.4 mg/dL (ref 0.3–1.2)
Total Protein: 7.2 g/dL (ref 6.5–8.1)

## 2022-04-22 LAB — CBC WITH DIFFERENTIAL/PLATELET
Abs Immature Granulocytes: 0.01 10*3/uL (ref 0.00–0.07)
Basophils Absolute: 0 10*3/uL (ref 0.0–0.1)
Basophils Relative: 0 %
Eosinophils Absolute: 0.1 10*3/uL (ref 0.0–0.5)
Eosinophils Relative: 2 %
HCT: 34.2 % — ABNORMAL LOW (ref 36.0–46.0)
Hemoglobin: 11.3 g/dL — ABNORMAL LOW (ref 12.0–15.0)
Immature Granulocytes: 0 %
Lymphocytes Relative: 37 %
Lymphs Abs: 1.8 10*3/uL (ref 0.7–4.0)
MCH: 34 pg (ref 26.0–34.0)
MCHC: 33 g/dL (ref 30.0–36.0)
MCV: 103 fL — ABNORMAL HIGH (ref 80.0–100.0)
Monocytes Absolute: 0.4 10*3/uL (ref 0.1–1.0)
Monocytes Relative: 8 %
Neutro Abs: 2.6 10*3/uL (ref 1.7–7.7)
Neutrophils Relative %: 53 %
Platelets: 319 10*3/uL (ref 150–400)
RBC: 3.32 MIL/uL — ABNORMAL LOW (ref 3.87–5.11)
RDW: 12.7 % (ref 11.5–15.5)
WBC: 5 10*3/uL (ref 4.0–10.5)
nRBC: 0 % (ref 0.0–0.2)

## 2022-04-22 SURGERY — LAPAROSCOPIC CHOLECYSTECTOMY WITH INTRAOPERATIVE CHOLANGIOGRAM
Anesthesia: General

## 2022-04-22 MED ORDER — BUPIVACAINE-EPINEPHRINE (PF) 0.25% -1:200000 IJ SOLN
INTRAMUSCULAR | Status: AC
  Filled 2022-04-22: qty 30

## 2022-04-22 MED ORDER — PHENYLEPHRINE 80 MCG/ML (10ML) SYRINGE FOR IV PUSH (FOR BLOOD PRESSURE SUPPORT)
PREFILLED_SYRINGE | INTRAVENOUS | Status: AC
  Filled 2022-04-22: qty 10

## 2022-04-22 MED ORDER — DEXAMETHASONE SODIUM PHOSPHATE 10 MG/ML IJ SOLN
INTRAMUSCULAR | Status: AC
  Filled 2022-04-22: qty 1

## 2022-04-22 MED ORDER — LIDOCAINE HCL (CARDIAC) PF 100 MG/5ML IV SOSY
PREFILLED_SYRINGE | INTRAVENOUS | Status: DC | PRN
  Administered 2022-04-22: 60 mg via INTRAVENOUS

## 2022-04-22 MED ORDER — ORAL CARE MOUTH RINSE: 15.0000 mL | Freq: Once | OROMUCOSAL | Status: AC

## 2022-04-22 MED ORDER — PHENYLEPHRINE HCL (PRESSORS) 10 MG/ML IV SOLN
INTRAVENOUS | Status: DC | PRN
  Administered 2022-04-22: 80 ug via INTRAVENOUS

## 2022-04-22 MED ORDER — DIPHENHYDRAMINE HCL 50 MG/ML IJ SOLN
INTRAMUSCULAR | Status: DC | PRN
  Administered 2022-04-22: 12.5 mg via INTRAVENOUS

## 2022-04-22 MED ORDER — SCOPOLAMINE 1 MG/3DAYS TD PT72
MEDICATED_PATCH | TRANSDERMAL | Status: AC
  Filled 2022-04-22: qty 1

## 2022-04-22 MED ORDER — SUGAMMADEX SODIUM 200 MG/2ML IV SOLN
INTRAVENOUS | Status: DC | PRN
  Administered 2022-04-22: 200 mg via INTRAVENOUS

## 2022-04-22 MED ORDER — VANCOMYCIN HCL IN DEXTROSE 1-5 GM/200ML-% IV SOLN
1000.0000 mg | Freq: Once | INTRAVENOUS | Status: AC
  Administered 2022-04-22: 1000 mg via INTRAVENOUS
  Filled 2022-04-22: qty 200

## 2022-04-22 MED ORDER — SCOPOLAMINE 1 MG/3DAYS TD PT72
MEDICATED_PATCH | TRANSDERMAL | Status: DC | PRN
  Administered 2022-04-22: 1 via TRANSDERMAL

## 2022-04-22 MED ORDER — ACETAMINOPHEN 500 MG PO TABS
ORAL_TABLET | ORAL | Status: AC
  Filled 2022-04-22: qty 2

## 2022-04-22 MED ORDER — FENTANYL CITRATE (PF) 100 MCG/2ML IJ SOLN
INTRAMUSCULAR | Status: DC | PRN
Start: 2022-04-22 — End: 2022-04-22
  Administered 2022-04-22: 50 ug via INTRAVENOUS
  Administered 2022-04-22 (×2): 100 ug via INTRAVENOUS

## 2022-04-22 MED ORDER — DIPHENHYDRAMINE HCL 50 MG/ML IJ SOLN
INTRAMUSCULAR | Status: AC
  Filled 2022-04-22: qty 1

## 2022-04-22 MED ORDER — LACTATED RINGERS IV SOLN: INTRAVENOUS | Status: DC | PRN

## 2022-04-22 MED ORDER — OXYCODONE HCL 5 MG/5ML PO SOLN: 5.0000 mg | Freq: Once | ORAL | Status: AC | PRN

## 2022-04-22 MED ORDER — CHLORHEXIDINE GLUCONATE 0.12 % MT SOLN
15.0000 mL | Freq: Once | OROMUCOSAL | Status: AC
  Administered 2022-04-22: 15 mL via OROMUCOSAL

## 2022-04-22 MED ORDER — OXYCODONE HCL 5 MG PO TABS: 5.0000 mg | ORAL_TABLET | Freq: Four times a day (QID) | ORAL | 0 refills | Status: AC | PRN

## 2022-04-22 MED ORDER — INDOCYANINE GREEN 25 MG IV SOLR
5.0000 mg | Freq: Once | INTRAVENOUS | Status: AC
  Administered 2022-04-22: 5 mg via INTRAVENOUS

## 2022-04-22 MED ORDER — BUPIVACAINE-EPINEPHRINE 0.25% -1:200000 IJ SOLN
INTRAMUSCULAR | Status: DC | PRN
  Administered 2022-04-22: 30 mL

## 2022-04-22 MED ORDER — HYDROMORPHONE HCL 1 MG/ML IJ SOLN: 0.2500 mg | INTRAMUSCULAR | Status: DC | PRN

## 2022-04-22 MED ORDER — ONDANSETRON HCL 4 MG/2ML IJ SOLN
INTRAMUSCULAR | Status: AC
  Filled 2022-04-22: qty 2

## 2022-04-22 MED ORDER — ONDANSETRON HCL 4 MG/2ML IJ SOLN: 4.0000 mg | Freq: Once | INTRAMUSCULAR | Status: DC | PRN

## 2022-04-22 MED ORDER — MIDAZOLAM HCL 2 MG/2ML IJ SOLN
INTRAMUSCULAR | Status: AC
  Filled 2022-04-22: qty 2

## 2022-04-22 MED ORDER — 0.9 % SODIUM CHLORIDE (POUR BTL) OPTIME
TOPICAL | Status: DC | PRN
  Administered 2022-04-22: 1000 mL

## 2022-04-22 MED ORDER — FENTANYL CITRATE (PF) 250 MCG/5ML IJ SOLN
INTRAMUSCULAR | Status: AC
  Filled 2022-04-22: qty 5

## 2022-04-22 MED ORDER — ACETAMINOPHEN 500 MG PO TABS
1000.0000 mg | ORAL_TABLET | Freq: Once | ORAL | Status: AC
  Administered 2022-04-22: 1000 mg via ORAL

## 2022-04-22 MED ORDER — INDOCYANINE GREEN 25 MG IV SOLR: 2.5000 mg | Freq: Once | INTRAVENOUS | Status: DC

## 2022-04-22 MED ORDER — ROCURONIUM BROMIDE 10 MG/ML (PF) SYRINGE
PREFILLED_SYRINGE | INTRAVENOUS | Status: AC
Start: 2022-04-22 — End: ?
  Filled 2022-04-22: qty 10

## 2022-04-22 MED ORDER — OXYCODONE HCL 5 MG PO TABS
ORAL_TABLET | ORAL | Status: AC
  Filled 2022-04-22: qty 1

## 2022-04-22 MED ORDER — ONDANSETRON HCL 4 MG/2ML IJ SOLN
INTRAMUSCULAR | Status: DC | PRN
  Administered 2022-04-22: 4 mg via INTRAVENOUS

## 2022-04-22 MED ORDER — PROPOFOL 10 MG/ML IV BOLUS
INTRAVENOUS | Status: AC
  Filled 2022-04-22: qty 20

## 2022-04-22 MED ORDER — PROPOFOL 10 MG/ML IV BOLUS
INTRAVENOUS | Status: DC | PRN
  Administered 2022-04-22: 200 mg via INTRAVENOUS

## 2022-04-22 MED ORDER — CHLORHEXIDINE GLUCONATE CLOTH 2 % EX PADS: 6.0000 | MEDICATED_PAD | Freq: Once | CUTANEOUS | Status: DC

## 2022-04-22 MED ORDER — LACTATED RINGERS IV SOLN: INTRAVENOUS | Status: DC

## 2022-04-22 MED ORDER — LIDOCAINE HCL (PF) 2 % IJ SOLN
INTRAMUSCULAR | Status: AC
  Filled 2022-04-22: qty 5

## 2022-04-22 MED ORDER — AMISULPRIDE (ANTIEMETIC) 5 MG/2ML IV SOLN: 10.0000 mg | Freq: Once | INTRAVENOUS | Status: DC | PRN

## 2022-04-22 MED ORDER — MIDAZOLAM HCL 5 MG/5ML IJ SOLN
INTRAMUSCULAR | Status: DC | PRN
  Administered 2022-04-22: 2 mg via INTRAVENOUS

## 2022-04-22 MED ORDER — GENTAMICIN SULFATE 40 MG/ML IJ SOLN
5.0000 mg/kg | INTRAVENOUS | Status: AC
  Administered 2022-04-22: 301.2 mg via INTRAVENOUS
  Filled 2022-04-22: qty 7.5

## 2022-04-22 MED ORDER — ACETAMINOPHEN 500 MG PO TABS
1000.0000 mg | ORAL_TABLET | ORAL | Status: AC
  Administered 2022-04-22: 1000 mg via ORAL
  Filled 2022-04-22: qty 2

## 2022-04-22 MED ORDER — OXYCODONE HCL 5 MG PO TABS
5.0000 mg | ORAL_TABLET | Freq: Once | ORAL | Status: AC | PRN
  Administered 2022-04-22: 5 mg via ORAL

## 2022-04-22 MED ORDER — ROCURONIUM BROMIDE 100 MG/10ML IV SOLN
INTRAVENOUS | Status: DC | PRN
  Administered 2022-04-22: 70 mg via INTRAVENOUS

## 2022-04-22 SURGICAL SUPPLY — 43 items
APPLICATOR ARISTA FLEXITIP XL (MISCELLANEOUS) IMPLANT
APPLIER CLIP 5 13 M/L LIGAMAX5 (MISCELLANEOUS) ×2
APPLIER CLIP ROT 10 11.4 M/L (STAPLE)
BAG COUNTER SPONGE SURGICOUNT (BAG) IMPLANT
CABLE HIGH FREQUENCY MONO STRZ (ELECTRODE) ×2 IMPLANT
CHLORAPREP W/TINT 26 (MISCELLANEOUS) ×2 IMPLANT
CLIP APPLIE 5 13 M/L LIGAMAX5 (MISCELLANEOUS) ×1 IMPLANT
CLIP APPLIE ROT 10 11.4 M/L (STAPLE) IMPLANT
COVER MAYO STAND XLG (MISCELLANEOUS) ×2 IMPLANT
COVER SURGICAL LIGHT HANDLE (MISCELLANEOUS) ×2 IMPLANT
DERMABOND ADVANCED (GAUZE/BANDAGES/DRESSINGS) ×1
DERMABOND ADVANCED .7 DNX12 (GAUZE/BANDAGES/DRESSINGS) ×1 IMPLANT
DISSECTOR BLUNT TIP ENDO 5MM (MISCELLANEOUS) IMPLANT
DRAPE C-ARM 42X120 X-RAY (DRAPES) IMPLANT
ELECT PENCIL ROCKER SW 15FT (MISCELLANEOUS) ×2 IMPLANT
ELECT REM PT RETURN 15FT ADLT (MISCELLANEOUS) ×2 IMPLANT
GLOVE BIO SURGEON STRL SZ7.5 (GLOVE) ×2 IMPLANT
GLOVE INDICATOR 8.0 STRL GRN (GLOVE) ×2 IMPLANT
GOWN STRL REUS W/ TWL XL LVL3 (GOWN DISPOSABLE) ×2 IMPLANT
GOWN STRL REUS W/TWL XL LVL3 (GOWN DISPOSABLE) ×4
GRASPER SUT TROCAR 14GX15 (MISCELLANEOUS) IMPLANT
HEMOSTAT ARISTA ABSORB 3G PWDR (HEMOSTASIS) IMPLANT
HEMOSTAT SNOW SURGICEL 2X4 (HEMOSTASIS) IMPLANT
IRRIG SUCT STRYKERFLOW 2 WTIP (MISCELLANEOUS) ×2
IRRIGATION SUCT STRKRFLW 2 WTP (MISCELLANEOUS) ×1 IMPLANT
KIT BASIN OR (CUSTOM PROCEDURE TRAY) ×2 IMPLANT
KIT TURNOVER KIT A (KITS) IMPLANT
NDL INSUFFLATION 14GA 120MM (NEEDLE) IMPLANT
NEEDLE INSUFFLATION 14GA 120MM (NEEDLE) IMPLANT
SCISSORS LAP 5X35 DISP (ENDOMECHANICALS) ×2 IMPLANT
SET CHOLANGIOGRAPH MIX (MISCELLANEOUS) IMPLANT
SET TUBE SMOKE EVAC HIGH FLOW (TUBING) ×2 IMPLANT
SLEEVE ADV FIXATION 5X100MM (TROCAR) ×4 IMPLANT
SPIKE FLUID TRANSFER (MISCELLANEOUS) ×2 IMPLANT
SUT MNCRL AB 4-0 PS2 18 (SUTURE) ×2 IMPLANT
SYR 10ML ECCENTRIC (SYRINGE) ×2 IMPLANT
SYS BAG RETRIEVAL 10MM (BASKET) ×2
SYSTEM BAG RETRIEVAL 10MM (BASKET) ×1 IMPLANT
TOWEL OR 17X26 10 PK STRL BLUE (TOWEL DISPOSABLE) ×2 IMPLANT
TOWEL OR NON WOVEN STRL DISP B (DISPOSABLE) IMPLANT
TRAY LAPAROSCOPIC (CUSTOM PROCEDURE TRAY) ×2 IMPLANT
TROCAR ADV FIXATION 5X100MM (TROCAR) ×2 IMPLANT
TROCAR BALLN 12MMX100 BLUNT (TROCAR) ×2 IMPLANT

## 2022-04-22 NOTE — H&P (Signed)
CC: Here today for surgery  HPI: Julie Wu is an 66 y.o. female with history of Crohn's (on [place]ell[place]), whom was seen in the office as a referral by Dr. Loreta Ave for evaluation of symptomatic cholelithiasis.  Right upper quadrant ultrasound 01/19/2022 demonstrated gallstones up to 1.3 cm. Normal gallbladder wall thickness of 1.5 mm. No pericholecystic fluid. CBD measured 3 mm.  CT enterography 02/12/2020 does show terminal and distal ileitis, favored represent Crohn's disease. Possible narrowing of the ileocecal junction without high-grade obstruction.  Since 2015, she has lost approximately 42 pounds intentionally. Beginning in March of this year, she reports that she had severe midepigastric pain that was reliably brought on by eating at [employer] where she had a cheeseburger and milkshake. This pain lasted for a period of time and was a deep boring cramp. She has had a subsequent attack that was less severe within the last month. Same location and same type of discomfort. These pains can reliably be avoided by modification of her diet where she has gone to a more bland diet avoiding all greasy/fatty foods. It does appear to be interfering with her quality of life and she misses certain things including an occasional dessert.  Seen in ER with RUQ pain 04/12/22 and [place] pain showed gallstones without wall thickening, CBD normal at 5 mm. LFTs normal. Lipase normal.  PMH: Crohn's  PSH: C-sx  FHx: Denies any known family history of colorectal, breast, endometrial or ovarian cancer  Social Hx: Denies use of tobacco/illicit drug. Occasional social EtOH use.  Review of Systems: A complete review of systems was obtained from the patient. I have reviewed this information and discussed as appropriate with the patient. See HPI as well for other ROS.  Past Medical History:  Diagnosis Date   Crohn disease (HCC)    Gallstones    Hypertension    Seizures (HCC)    has a hx as a child after a head  injury, has been worked up and cleared by neurology    Past Surgical History:  Procedure Laterality Date   CARPAL TUNNEL RELEASE Left    CESAREAN SECTION     x2 1992 and 1997    No family history on file.  Social:  reports that she has never smoked. She has never used smokeless tobacco. She reports current alcohol use. She reports that she does not use drugs.  Allergies:  Allergies  Allergen Reactions   Penicillins Anaphylaxis    Childhood   Sulfa Antibiotics Anaphylaxis    Childhood    Medications: I have reviewed the patient's current medications.  No results found for this or any previous visit (from the past 48 hour(s)).  No results found.  ROS - all of the below systems have been reviewed with the patient and positives are indicated with bold text General: chills, fever or night sweats Eyes: blurry vision or double vision ENT: epistaxis or sore throat Allergy/Immunology: itchy/watery eyes or nasal congestion Hematologic/Lymphatic: bleeding problems, blood clots or swollen lymph nodes Endocrine: temperature intolerance or unexpected weight changes Breast: new or changing breast lumps or nipple discharge Resp: cough, shortness of breath, or wheezing CV: chest pain or dyspnea on exertion GI: as per HPI GU: dysuria, trouble voiding, or hematuria MSK: joint pain or joint stiffness Neuro: TIA or stroke symptoms Derm: pruritus and skin lesion changes Psych: anxiety and depression  PE There were no vitals taken for this visit. Constitutional: NAD; conversant Eyes: Moist conjunctiva; anicteric sclera Lungs: Normal respiratory effort  CV: RRR GI: Abd soft, NT/ND MSK: Normal range of motion of extremities Psychiatric: Appropriate affect; alert and oriented x3  No results found for this or any previous visit (from the past 48 hour(s)).  No results found.  A/P: Julie Wu is an 66 y.o. female with hx of Crohn's here for surgery regarding symptomatic  cholelithiasis  She does appear to have a relatively classic story for biliary colic  -The anatomy and physiology of the hepatobiliary system was discussed with the patient with associated pictures using our laparoscopic gallbladder surgery handbook. We also spent time reviewing the pathophysiology of gallbladder disease. -The options for treatment were discussed including ongoing observation which carries some risk of subsequent gallbladder complications (infection, pancreatitis, choledocholithiasis, etc). We reviewed surgery as the most definitive treatment option moving forward - laparoscopic cholecystectomy. -The planned procedure, material risks (including, but not limited to, pain, bleeding, infection, scarring, need for blood transfusion, damage to surrounding structures- blood vessels/nerves/viscus/organs, damage to bile duct, bile leak, need for additional procedures, hernia, worsening of pre-existing medical conditions, pancreatitis, pneumonia, heart attack, stroke, death) benefits and alternatives to surgery were discussed at length. I noted a good probability that the procedure would help improve their symptoms. The patient's questions were answered to her satisfaction, she voiced understanding and elected to proceed with surgery. Additionally, we discussed typical postoperative expectations and the recovery process including possibility that not all symptoms resolve with surgery.  Marin Olp, MD [facility]

## 2022-04-22 NOTE — Transfer of Care (Signed)
Immediate Anesthesia Transfer of Care Note  Patient: Julie Wu  Procedure(s) Performed: LAPAROSCOPIC CHOLECYSTECTOMY WITH ICG DYE  Patient Location: PACU  Anesthesia Type:General  Level of Consciousness: awake, alert , oriented and patient cooperative  Airway & Oxygen Therapy: Patient Spontanous Breathing and Patient connected to face mask oxygen  Post-op Assessment: Report given to RN, Post -op Vital signs reviewed and stable and Patient moving all extremities X 4  Post vital signs: Reviewed and stable  Last Vitals:  Vitals Value Taken Time  BP 160/71 04/22/22 1200  Temp 36.3 C 04/22/22 1157  Pulse 68 04/22/22 1202  Resp 13 04/22/22 1202  SpO2 100 % 04/22/22 1202  Vitals shown include unvalidated device data.  Last Pain:  Vitals:   04/22/22 1157  PainSc: 0-No pain         Complications: No notable events documented.

## 2022-04-22 NOTE — Anesthesia Postprocedure Evaluation (Signed)
Anesthesia Post Note  Patient: Julie Wu  Procedure(s) Performed: LAPAROSCOPIC CHOLECYSTECTOMY WITH ICG DYE     Patient location during evaluation: PACU Anesthesia Type: General Level of consciousness: awake and alert, oriented and patient cooperative Pain management: pain level controlled Vital Signs Assessment: post-procedure vital signs reviewed and stable Respiratory status: spontaneous breathing, nonlabored ventilation and respiratory function stable Cardiovascular status: blood pressure returned to baseline and stable Postop Assessment: no apparent nausea or vomiting Anesthetic complications: no   No notable events documented.  Last Vitals:  Vitals:   04/22/22 1200 04/22/22 1215  BP: (!) 160/71 (!) 165/70  Pulse: 71 66  Resp: 13 16  Temp:    SpO2: 100% 97%    Last Pain:  Vitals:   04/22/22 1215  PainSc: 4                  Lannie Fields

## 2022-04-22 NOTE — Anesthesia Procedure Notes (Signed)
Procedure Name: Intubation Date/Time: 04/22/2022 10:30 AM  Performed by: Jonna Munro, CRNAPre-anesthesia Checklist: Patient identified, Emergency Drugs available, Suction available, Patient being monitored and Timeout performed Patient Re-evaluated:Patient Re-evaluated prior to induction Oxygen Delivery Method: Circle system utilized Preoxygenation: Pre-oxygenation with 100% oxygen Induction Type: IV induction Ventilation: Mask ventilation without difficulty Laryngoscope Size: Mac, Glidescope and 3 Grade View: Grade I Tube type: Oral Tube size: 7.0 mm Number of attempts: 1 Airway Equipment and Method: Rigid stylet and Video-laryngoscopy Placement Confirmation: ETT inserted through vocal cords under direct vision, positive ETCO2, CO2 detector and breath sounds checked- equal and bilateral Secured at: 22 cm Tube secured with: Tape Dental Injury: Teeth and Oropharynx as per pre-operative assessment

## 2022-04-22 NOTE — Discharge Instructions (Signed)
POST OP INSTRUCTIONS  DIET: As tolerated. Follow a light bland diet the first 24 hours after arrival home, such as soup, liquids, crackers, etc.  Be sure to include lots of fluids daily.  Avoid fast food or heavy meals as your are more likely to get nauseated.  Eat a low fat the next few days after surgery.  Take your usually prescribed home medications unless otherwise directed.  PAIN CONTROL: Pain is best controlled by a usual combination of three different methods TOGETHER: Ice/Heat Over the counter pain medication Prescription pain medication Most patients will experience some swelling and bruising around the surgical site.  Ice packs or heating pads (30-60 minutes up to 6 times a day) will help. Some people prefer to use ice alone, heat alone, alternating between ice & heat.  Experiment to what works for you.  Swelling and bruising can take several weeks to resolve.   It is helpful to take an over-the-counter pain medication regularly for the first few weeks: Ibuprofen (Motrin/Advil) - 200mg tabs - take 3 tabs (600mg) every 6 hours as needed for pain Acetaminophen (Tylenol) - you may take 650mg every 6 hours as needed. You can take this with motrin as they act differently on the body. If you are taking a narcotic pain medication that has acetaminophen in it, do not take over the counter tylenol at the same time.  Iii. NOTE: You may take both of these medications together - most patients  find it most helpful when alternating between the two (i.e. Ibuprofen at 6am,  tylenol at 9am, ibuprofen at 12pm ...) A  prescription for pain medication should be given to you upon discharge.  Take your pain medication as prescribed if your pain is not adequatly controlled with the over-the-counter pain reliefs mentioned above.  Avoid getting constipated.  Between the surgery and the pain medications, it is common to experience some constipation.  Increasing fluid intake and taking a fiber supplement (such as  Metamucil, Citrucel, FiberCon, MiraLax, etc) 1-2 times a day regularly will usually help prevent this problem from occurring.  A mild laxative (prune juice, Milk of Magnesia, MiraLax, etc) should be taken according to package directions if there are no bowel movements after 48 hours.    Dressing: Your incisions are covered in Dermabond which is like sterile superglue for the skin. This will come off on it's own in a couple weeks. It is waterproof and you may bathe normally starting the day after your surgery in a shower. Avoid baths/pools/lakes/oceans until your wounds have fully healed.  ACTIVITIES as tolerated:   Avoid heavy lifting (>10lbs or 1 gallon of milk) for the next 6 weeks. You may resume regular (light) daily activities beginning the next day--such as daily self-care, walking, climbing stairs--gradually increasing activities as tolerated.  If you can walk 30 minutes without difficulty, it is safe to try more intense activity such as jogging, treadmill, bicycling, low-impact aerobics.  DO NOT PUSH THROUGH PAIN.  Let pain be your guide: If it hurts to do something, don't do it. You may drive when you are no longer taking prescription pain medication, you can comfortably wear a seatbelt, and you can safely maneuver your car and apply brakes.   FOLLOW UP in our office Please call [facility] at [phone] to set up an appointment to see your surgeon in the office for a follow-up appointment approximately 2 weeks after your surgery. Make sure that you call for this appointment the day you arrive home to   insure a convenient appointment time.  9. If you have disability or family leave forms that need to be completed, you may have them completed by your primary care physician's office; for return to work instructions, please ask our office staff and they will be happy to assist you in obtaining this documentation   When to call us [phone]: Poor pain control Reactions / problems with new  medications (rash/itching, etc)  Fever over 101.5 F (38.5 C) Inability to urinate Nausea/vomiting Worsening swelling or bruising Continued bleeding from incision. Increased pain, redness, or drainage from the incision  The clinic staff is available to answer your questions during regular business hours (8:30am-5pm).  Please don't hesitate to call and ask to speak to one of our nurses for clinical concerns.   A surgeon from [facility] is always on call at the hospitals   If you have a medical emergency, go to the nearest emergency room or call 911.  [facility] [address], [address], [place], [place]  [address] MAIN: [phone] FAX: [phone] [email or web address]  

## 2022-04-22 NOTE — Op Note (Signed)
04/22/2022 11:46 AM  PATIENT: Julie Wu  66 y.o. female  Patient Care Team: Piedad Climes, [place] as PCP - General (Family Medicine)  PRE-OPERATIVE DIAGNOSIS: Symptomatic cholelithiasis  POST-OPERATIVE DIAGNOSIS: Symptomatic cholelithiasis  PROCEDURE: Laparoscopic cholecystectomy with indocyanine green (ICG) cholangiography  SURGEON: Stephanie Coup. Cliffton Asters, MD  ASSISTANT: Cristi Loron MD PGY-5  ANESTHESIA: General endotracheal  EBL: Total I/O In: 607.9 [I.V.:500; IV Piggyback:107.9] Out: -   DRAINS: None  SPECIMEN: Gallbladder  COUNTS: Sponge, needle and instrument counts were reported correct x2 at the conclusion of the operation  DISPOSITION: PACU in satisfactory condition  COMPLICATIONS: None  FINDINGS: Gallbladder was omental adhesions consistent with prior inflammation/fibrosis.  Large gallstone in the infundibulum that was not acutely impacted.  Filmy violent string type adhesions on the cephalad portion of the liver.  Cholecystectomy carried out.  ICG cholangiography was utilized and demonstrated a patent cystic duct and filling of the gallbladder.  There is also evidence of activity within the wall of the duodenum consistent with a patent biliary tree. Critical view of safety achieved prior to clipping or dividing any structures.  DESCRIPTION:   The patient was identified & brought into the operating room. She was then positioned supine on the OR table. SCDs were in place and active during the entire case. She then underwent general endotracheal anesthesia. Pressure points were padded. Hair on the abdomen was clipped by the OR team. The abdomen was prepped and draped in the standard sterile fashion. Antibiotics were administered. A surgical timeout was performed and confirmed our plan.   An infraumbilical incision was made. The umbilical stalk was grasped and retracted outwardly. The infraumbilical fascia was identified and incised. The peritoneal cavity was  gently entered bluntly. A purse-string 0 Vicryl suture was placed. The Hasson cannula was inserted into the peritoneal cavity and insufflation with CO2 commenced to . A laparoscope was inserted into the peritoneal cavity and inspection confirmed no evidence of trocar site complications. The patient was then positioned in reverse Trendelenburg with slight left side down. 3 additional 62mm trocars were placed along the right subcostal line - one 40mm port in mid subcostal region, another 85mm port in the right flank near the anterior axillary line, and a third 75mm port in the left subxiphoid region obliquely near the falciform ligament.  The liver and gallbladder were inspected.  Violin string-type adhesions were noted above the cephalad portion of the liver.  These were lysed to the extent necessary to facilitate elevation and retraction of the gallbladder for cholecystectomy. The gallbladder fundus was grasped and elevated cephalad. An additional grasper was then placed on the infundibulum of the gallbladder and the infundibulum was retracted laterally. Staying high on the gallbladder, the peritoneum on both sides of the gallbladder was opened with hook cautery. Gentle blunt dissection was then employed with a Art gallery manager working down into [employer]. The cystic duct was identified and carefully circumferentially dissected. The cystic artery was also identified and carefully circumferentially dissected. The space between the cystic artery and hepatocystic plate was developed such that a good view of the liver could be seen through a window medial to her cystic artery. The triangle of Calot had been cleared of all fibrofatty tissue. At this point, a critical view of safety was achieved and the only structures visualized was the skeletonized cystic duct laterally, the skeletonized cystic artery and the liver through the window medial to the artery. No posterior cystic artery was noted  Under near  infrared light, ICG  tracer is noted to be present within the gallbladder.  The cystic duct illuminates nicely.  Through the fibrofatty tissue in the porta, we are able to see candidate biliary structures.  The cystic artery has no evident ICG as expected.  The anterior portion of the duodenum is visualized and there is ICG visit through this as well consistent with a patent biliary system.  The cystic duct and artery were clipped with 2 clips on the patient side and 1 clip on the specimen side. The cystic duct and artery were then divided. The gallbladder was then freed from its remaining attachments to the liver using electrocautery and placed into an endocatch bag. The RUQ was gently irrigated with sterile saline. Hemostasis was then verified. The clips were in good position; the gallbladder fossa was dry. The rest of the abdomen was inspected no injury nor bleeding elsewhere was identified.  The endocatch bag containing the gallbladder was then removed from the umbilical port site and passed off as specimen. The RUQ ports were removed under direct visualization and noted to be hemostatic. The umbilical fascia was then closed using the 0 Vicryl purse-string suture. The fascia was palpated and noted to be completely closed. The skin of all incision sites was approximated with 4-0 monocryl subcuticular suture and dermabond applied. She was then awakened from anesthesia, extubated, and transferred to a stretcher for transport to PACU in satisfactory condition.

## 2022-04-23 ENCOUNTER — Encounter (HOSPITAL_COMMUNITY): Payer: Self-pay | Admitting: Surgery

## 2022-04-25 LAB — SURGICAL PATHOLOGY

## 2022-05-02 ENCOUNTER — Ambulatory Visit: Admit: 2022-05-02 | Payer: BC Managed Care – PPO | Admitting: Surgery

## 2022-05-02 SURGERY — LAPAROSCOPIC CHOLECYSTECTOMY WITH INTRAOPERATIVE CHOLANGIOGRAM
Anesthesia: General

## 2023-03-03 IMAGING — US US ABDOMEN LIMITED
1 series · 14 of 25 positions shown · non-contrast
Comparison: CT enterography 02/12/2020

CLINICAL DATA: Right upper quadrant abdominal pain.

EXAM:
ULTRASOUND ABDOMEN LIMITED RIGHT UPPER QUADRANT

[Series 1: us abdomen limited ruq (liver/gb) · 14 of 74 slices shown]
[im 1/74]
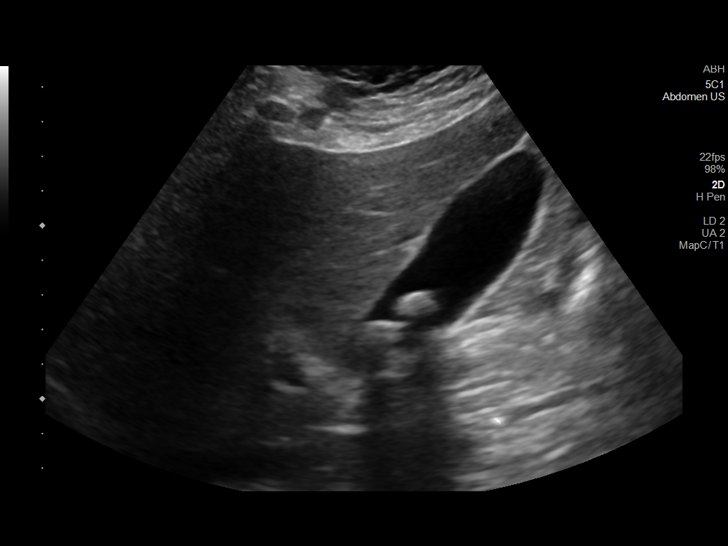
[im 7/74]
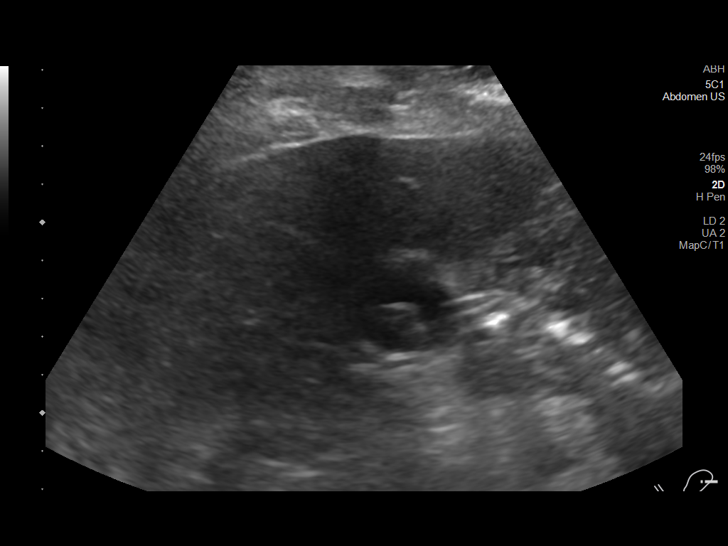
[im 13/74]
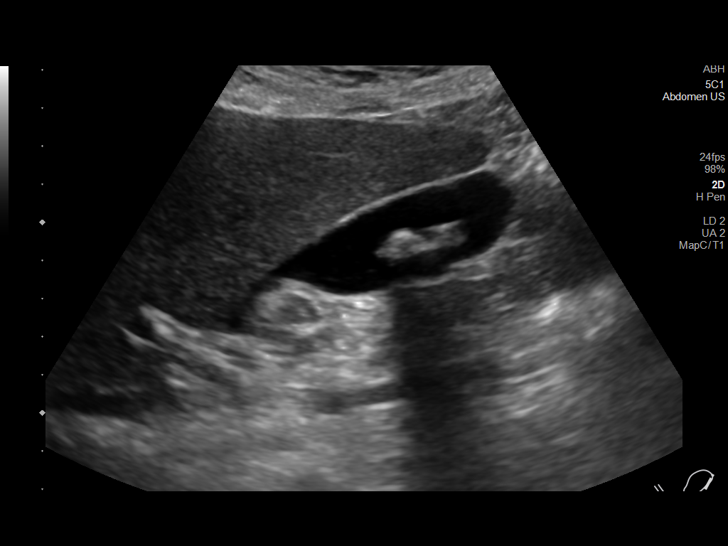
[im 19/74]
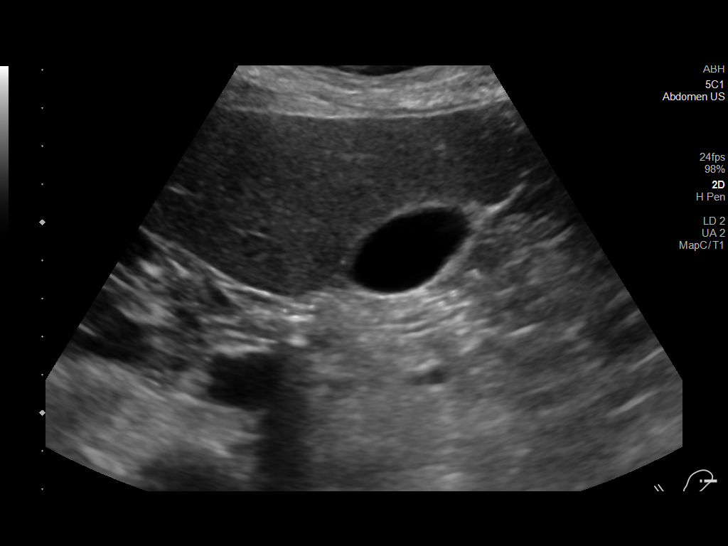
[im 25/74]
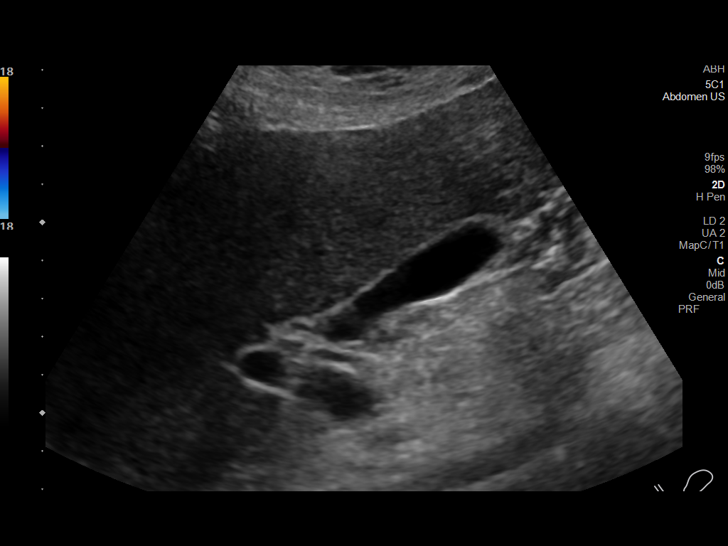
[im 28/74]
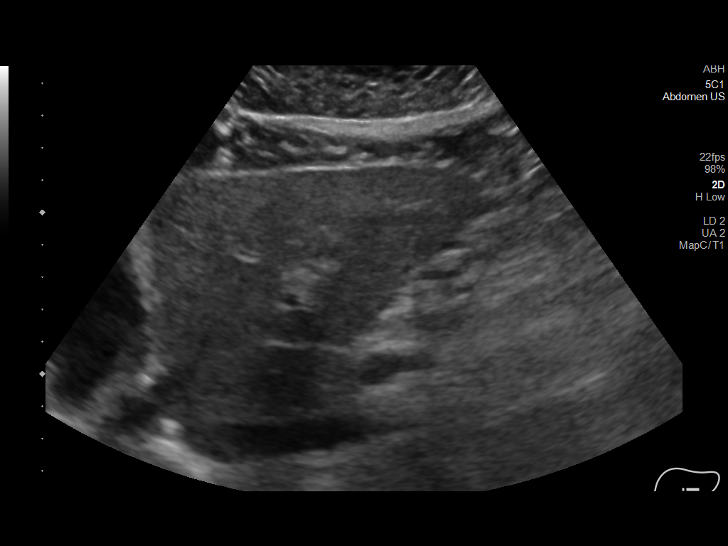
[im 34/74]
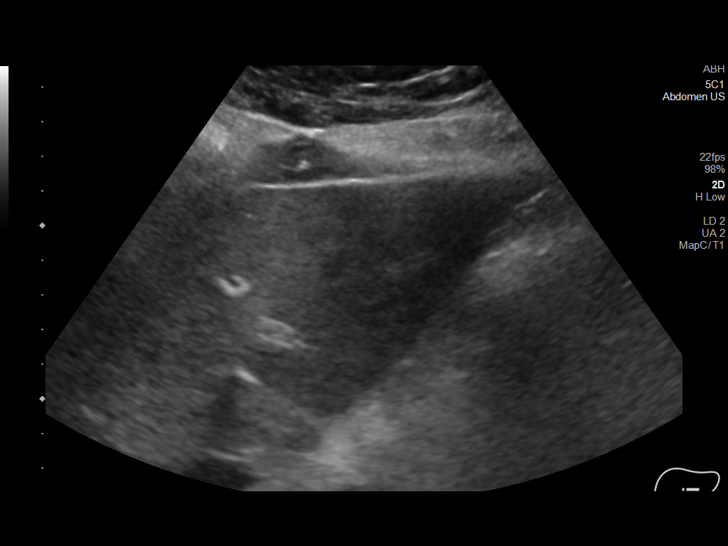
[im 40/74]
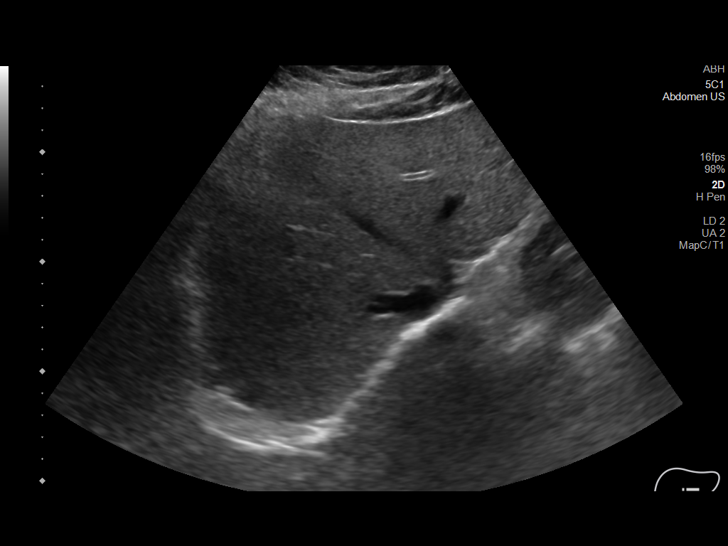
[im 46/74]
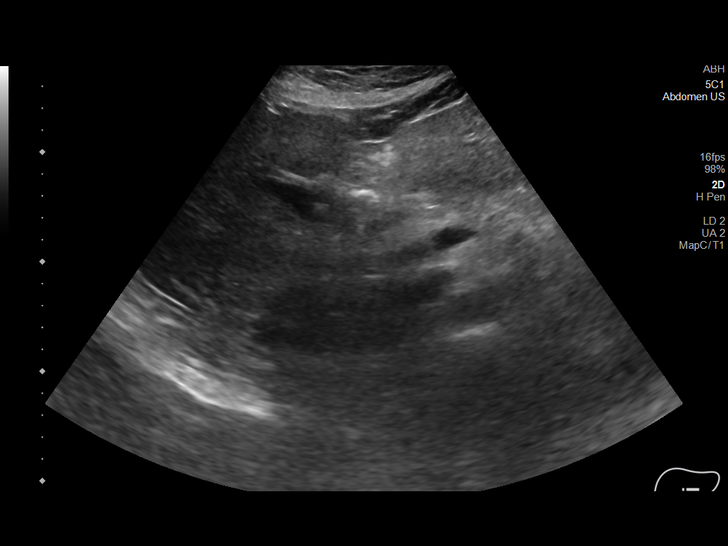
[im 49/74]
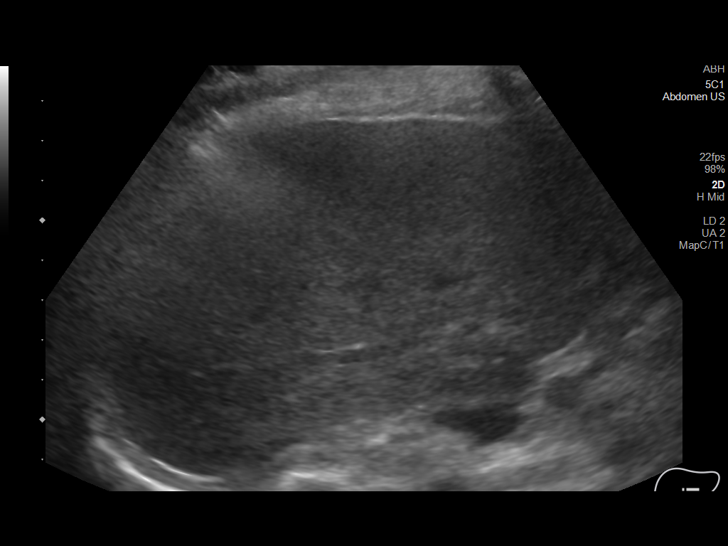
[im 55/74]
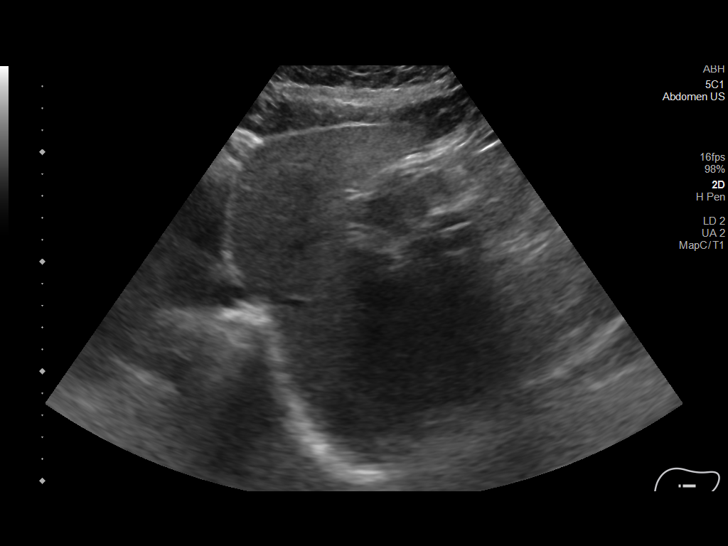
[im 61/74]
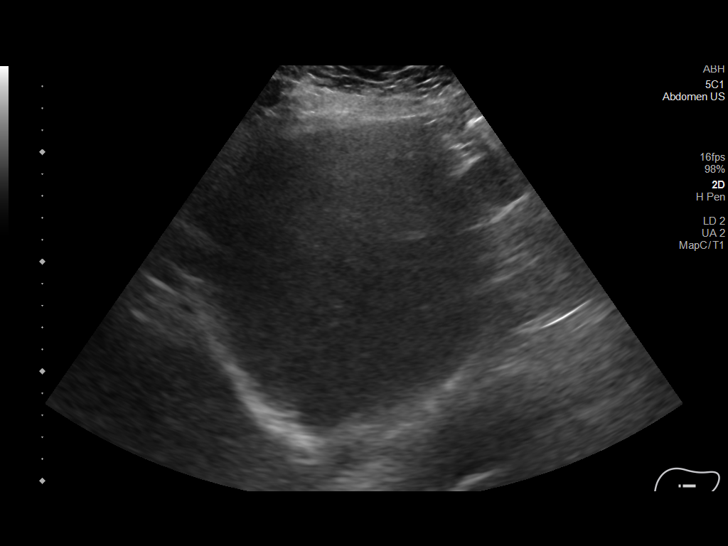
[im 67/74]
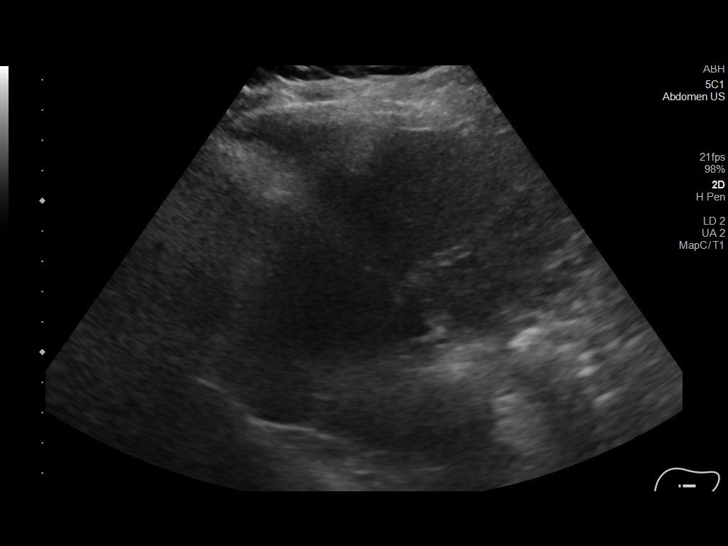
[im 74/74]
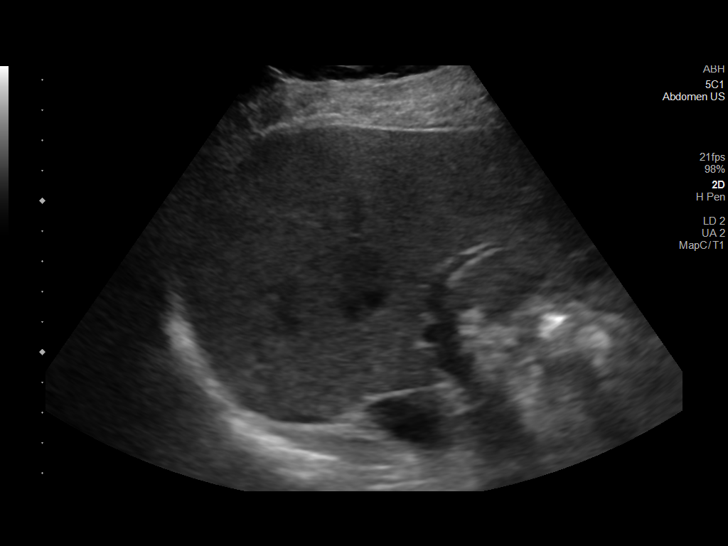

[14 of 25 positions shown; findings below may reference images not displayed]

FINDINGS: Gallbladder:

Multiple echogenic shadowing gallstones are seen, measuring up to
1.3 cm. Negative sonographic Murphy's sign. Normal gallbladder wall
thickness of 1.5 mm. No pericholecystic fluid.

Common bile duct:

Diameter: 3 mm

Liver:

No focal lesion identified. Moderately increased parenchymal
echogenicity and attenuation suggesting fatty infiltration. Portal
vein is patent on color Doppler imaging with normal direction of
blood flow towards the liver.

Other: None.
IMPRESSION: :
IMPRESSION: 1. Cholelithiasis without sonographic evidence of acute
cholecystitis.
2. Fatty liver.
